# Patient Record
Sex: Female | Born: 1950
Health system: Southern US, Community
[De-identification: ages and names within clinical notes are randomized; demographics above are authoritative.]

## PROBLEM LIST (undated history)

## (undated) ENCOUNTER — Ambulatory Visit: Payer: Self-pay | Source: Home / Self Care

## (undated) DIAGNOSIS — E785 Hyperlipidemia, unspecified: Secondary | ICD-10-CM

## (undated) DIAGNOSIS — E7800 Pure hypercholesterolemia, unspecified: Secondary | ICD-10-CM

## (undated) DIAGNOSIS — M1990 Unspecified osteoarthritis, unspecified site: Secondary | ICD-10-CM

## (undated) DIAGNOSIS — R079 Chest pain, unspecified: Secondary | ICD-10-CM

## (undated) DIAGNOSIS — I10 Essential (primary) hypertension: Secondary | ICD-10-CM

## (undated) DIAGNOSIS — M810 Age-related osteoporosis without current pathological fracture: Secondary | ICD-10-CM

## (undated) DIAGNOSIS — E559 Vitamin D deficiency, unspecified: Secondary | ICD-10-CM

## (undated) DIAGNOSIS — G2581 Restless legs syndrome: Secondary | ICD-10-CM

## (undated) HISTORY — DX: Pure hypercholesterolemia, unspecified: E78.00

## (undated) HISTORY — DX: Age-related osteoporosis without current pathological fracture: M81.0

## (undated) HISTORY — DX: Vitamin D deficiency, unspecified: E55.9

## (undated) HISTORY — DX: Unspecified osteoarthritis, unspecified site: M19.90

## (undated) HISTORY — DX: Restless legs syndrome: G25.81

## (undated) HISTORY — DX: Hyperlipidemia, unspecified: E78.5

## (undated) HISTORY — DX: Chest pain, unspecified: R07.9

---

## 2004-03-16 ENCOUNTER — Emergency Department (HOSPITAL_COMMUNITY): Admission: EM | Admit: 2004-03-16 | Discharge: 2004-03-16 | Payer: Self-pay | Admitting: Emergency Medicine

## 2005-07-16 ENCOUNTER — Encounter: Admission: RE | Admit: 2005-07-16 | Discharge: 2005-07-16 | Payer: Self-pay | Admitting: Internal Medicine

## 2006-10-20 ENCOUNTER — Ambulatory Visit (HOSPITAL_COMMUNITY): Admission: RE | Admit: 2006-10-20 | Discharge: 2006-10-20 | Payer: Self-pay | Admitting: Obstetrics and Gynecology

## 2006-10-20 ENCOUNTER — Encounter (INDEPENDENT_AMBULATORY_CARE_PROVIDER_SITE_OTHER): Payer: Self-pay | Admitting: Obstetrics and Gynecology

## 2010-08-13 NOTE — Op Note (Signed)
[name], Jasmin Mcintosh                 ACCOUNT NO.:  [id number]   MEDICAL RECORD NO.:  [id number]          PATIENT TYPE:  AMB   LOCATION:  SDC                           FACILITY:  [facility]   PHYSICIAN:  Kendra H. Tenny Craw, MD     DATE OF BIRTH:  1950-11-16   DATE OF PROCEDURE:  10/20/2006  DATE OF DISCHARGE:                               OPERATIVE REPORT   PREOPERATIVE DIAGNOSES:  1. Retained intrauterine device.  2. Cervical stenosis.   POSTOPERATIVE DIAGNOSES:  1. Retained intrauterine device.  2. Cervical stenosis.   PROCEDURE:  Cervical dilation and removal of IUD.   SURGEON:  Freddrick March. Tenny Craw, MD.   ASSISTANT:  None.   ANESTHESIA:  MAC.   SPECIMEN:  Intrauterine device to pathology.   ESTIMATED BLOOD LOSS:  None.   COMPLICATIONS:  None.   PROCEDURE:  Ms. Word is a 60 year old postmenopausal woman who had an  intrauterine device placed at age 84 in [place] when she was started on  hormone replacement therapy.  She presented to the office for IUD  removal approximately one month ago.  At the time of attempted IUD  removal, the strings attached to the IUD avulsed.  Attempts to remove  the IUD in the office were unsuccessful and the patient could not  tolerate further procedure.  The decision was made to proceed with exam  under anesthesia and removal of the IUD under anesthesia.  Following the  appropriate informed consent, the patient was brought to the operating  room where MAC anesthesia was administered and found to be adequate.  She was placed in the dorsal supine position in [name] stirrups, prepped  and draped in the normal sterile fashion.  The speculum was placed into  the vagina and the anterior lip of the cervix was grasped with a single-  tooth tenaculum.  I injected 10 mL of 1% lidocaine in a paracervical  fashion and the cervix was serially dilated with Hank dilators.  A  uterine forceps was then passed transcervically to the fundus and then  the  IUD was grasped and  removed without complication.  The single-tooth  tenaculum was removed from the anterior lip of the cervix and the  speculum was removed from the vagina.  The patient tolerated the  procedure well and was brought to the recovery room in stable condition  following the procedure.      Freddrick March. Tenny Craw, MD  Electronically Signed     KHR/MEDQ  D:  10/20/2006  T:  10/20/2006  Job:  [id number]

## 2011-01-13 LAB — CBC
HCT: 40.7
Hemoglobin: 13.9
MCHC: 34.2
MCV: 88.7
Platelets: 277
RBC: 4.59
RDW: 11.9
WBC: 6

## 2013-06-09 DIAGNOSIS — E785 Hyperlipidemia, unspecified: Secondary | ICD-10-CM | POA: Insufficient documentation

## 2017-10-14 ENCOUNTER — Institutional Professional Consult (permissible substitution): Payer: Self-pay | Admitting: Pulmonary Disease

## 2017-10-15 ENCOUNTER — Ambulatory Visit (INDEPENDENT_AMBULATORY_CARE_PROVIDER_SITE_OTHER): Payer: Federal, State, Local not specified - PPO | Admitting: Pulmonary Disease

## 2017-10-15 ENCOUNTER — Other Ambulatory Visit: Payer: Federal, State, Local not specified - PPO

## 2017-10-15 ENCOUNTER — Encounter: Payer: Self-pay | Admitting: Pulmonary Disease

## 2017-10-15 ENCOUNTER — Ambulatory Visit (INDEPENDENT_AMBULATORY_CARE_PROVIDER_SITE_OTHER)
Admission: RE | Admit: 2017-10-15 | Discharge: 2017-10-15 | Disposition: A | Discharge status: Home / Self Care | Payer: Federal, State, Local not specified - PPO | Source: Ambulatory Visit | Attending: Pulmonary Disease | Admitting: Pulmonary Disease

## 2017-10-15 VITALS — BP 138/80 | HR 74 | Ht 64.0 in | Wt 115.2 lb

## 2017-10-15 DIAGNOSIS — R059 Cough, unspecified: Secondary | ICD-10-CM

## 2017-10-15 DIAGNOSIS — R05 Cough: Secondary | ICD-10-CM | POA: Diagnosis not present

## 2017-10-15 NOTE — Patient Instructions (Signed)
Recurrent cough  Improvement in symptoms following use of antibiotics and steroids  Currently on over-the-counter antireflux  Will obtain a chest x-ray  Obtain a pulmonary function study  Hypersensitivity panel  Return to the office in about 8 weeks  If recurrence of symptoms prior to next visit, encouraged to call and we will try and see you in the office when you are having active symptoms

## 2017-10-15 NOTE — Progress Notes (Signed)
Jasmin DakinHansa Mcintosh    [id number]    [birth year]  Primary Care Physician:Patient, No Pcp Per  Referring Physician: Verlon AuBoyd, Tammy Lamonica, MD [address] [place] [id number]  Chief complaint:   Cough and shortness of breath Recent treatment for bronchitis  HPI:  Has had recurrent respiratory tract infections recently with cough and shortness of breath, cough productive of clear phlegm Not feeling acutely ill at present Does not have any history of the asthma No history of significant allergies Usually healthy only taking medications for cholesterol-which is well controlled Never smoker  During one recent exacerbation she did have some wheezing  Does not wheeze on a regular basis, she is not limited with activities She has no symptoms suggesting reflux--with recurrence of symptoms this is been considered and she was encouraged to take over-the-counter reflux medications  Pets: No pets Occupation: Occasional nasal stuffiness and congestion at work, exposure to [employer] materials Smoking history: Never smoker  travel history: Relevant family history: No significant contributory family history  Outpatient Encounter Medications as of 10/15/2017  Medication Sig  . albuterol (PROVENTIL HFA;VENTOLIN HFA) 108 (90 Base) MCG/ACT inhaler INHALE 2 PUFFS BY MOUTH EVERY 6 HOURS AS NEEDED FOR WHEEZING  . atorvastatin (LIPITOR) 20 MG tablet Take 20 mg by mouth daily.  . cetirizine (ZYRTEC) 10 MG tablet Take 10 mg by mouth daily.  . cholecalciferol (QC VITAMIN D3) 400 units TABS tablet Take 800 Units by mouth.  . ibandronate (BONIVA) 150 MG tablet Take 150 mg by mouth every 30 (thirty) days. Take in the morning with a full glass of water, on an empty stomach, and do not take anything else by mouth or lie down for the next 30 min.  . Ibuprofen (ADVIL PO) Take by mouth.  . pantoprazole (PROTONIX) 40 MG tablet Take 40 mg by mouth daily.  . traMADol (ULTRAM) 50 MG tablet  Take by mouth every 6 (six) hours as needed.  . [DISCONTINUED] predniSONE (DELTASONE) 50 MG tablet TAKE 1 TABLET BY MOUTH ONCE DAILY FOR 5 DAYS  . [DISCONTINUED] azithromycin (ZITHROMAX) 250 MG tablet    No facility-administered encounter medications on file as of 10/15/2017.     Allergies as of 10/15/2017  . (Not on File)    Past Medical History:  Diagnosis Date  . Arthritis   . Chest pain at rest   . Hypercholesterolemia   . Hyperlipemia   . Osteoporosis   . Restless leg   . Vitamin D deficiency   . Vitamin D insufficiency      Family History  Problem Relation Age of Onset  . Diabetes Father   . Hyperlipidemia Sister     Social History   Socioeconomic History  . Marital status: Married    Spouse name: Not on file  . Number of children: Not on file  . Years of education: Not on file  . Highest education level: Not on file  Occupational History  . Not on file  Social Needs  . Financial resource strain: Not on file  . Food insecurity:    Worry: Not on file    Inability: Not on file  . Transportation needs:    Medical: Not on file    Non-medical: Not on file  Tobacco Use  . Smoking status: Never Smoker  . Smokeless tobacco: Never Used  Substance and Sexual Activity  . Alcohol use: Not Currently    Frequency: Never  . Drug use:  Never  . Sexual activity: Not on file  Lifestyle  . Physical activity:    Days per week: Not on file    Minutes per session: Not on file  . Stress: Not on file  Relationships  . Social connections:    Talks on phone: Not on file    Gets together: Not on file    Attends religious service: Not on file    Active member of club or organization: Not on file    Attends meetings of clubs or organizations: Not on file    Relationship status: Not on file  . Intimate partner violence:    Fear of current or ex partner: Not on file    Emotionally abused: Not on file    Physically abused: Not on file    Forced sexual activity: Not on  file  Other Topics Concern  . Not on file  Social History Narrative  . Not on file    Review of Systems  Constitutional: Negative.   HENT: Negative.   Respiratory: Positive for cough and shortness of breath.   Cardiovascular: Negative.   Gastrointestinal: Negative.   Endocrine: Negative.   Genitourinary: Negative.   Musculoskeletal: Negative.   Allergic/Immunologic: Negative.   Neurological: Negative.   Hematological: Negative.     Vitals:   10/15/17 1410  BP: 138/80  Pulse: 74  SpO2: 97%     Physical Exam  Constitutional: She is oriented to person, place, and time. She appears well-developed and well-nourished.  HENT:  Head: Normocephalic and atraumatic.  Eyes: Pupils are equal, round, and reactive to light. Conjunctivae and EOM are normal. Right eye exhibits no discharge. Left eye exhibits no discharge.  Neck: Normal range of motion. Neck supple.  Cardiovascular: Normal rate and regular rhythm.  Pulmonary/Chest: Effort normal. No respiratory distress. She has no wheezes.  Abdominal: Soft. Bowel sounds are normal. She exhibits no distension.  Musculoskeletal: Normal range of motion. She exhibits no edema or deformity.  Neurological: She is alert and oriented to person, place, and time. No cranial nerve deficit.  Skin: Skin is warm and dry. No erythema.  Psychiatric: She has a normal mood and affect.    Data Reviewed:  Recent blood work reviewed revealing elevated eosinophils  Other blood work are negative Chest x-ray reviewed--last one was in January-no infiltrate  Assessment:  1.  Recurrent bronchitis-symptoms are improving recently with recent use of antibiotics  2.  Shortness of breath--she is not limited with activities  3.  Chronic recurrent cough-has had at least 4 recent exacerbations requiring intervention  4.  Possible asthma-rare wheezing, possible allergic asthma   Plan/Recommendations: . Obtain pulmonary function study  .  Hypersensitivity  panel  .  Will repeat a chest x-ray  .  We will see her back in the office in about 8 weeks  .  Encouraged to continue using over-the-counter reflux medications  Virl Diamond MD [facility] 10/15/2017, 3:36 PM  CC: Verlon Au, MD

## 2017-10-21 LAB — HYPERSENSITIVITY PNEUMONITIS
A. Pullulans Abs: NEGATIVE
A.Fumigatus #1 Abs: NEGATIVE
Micropolyspora faeni, IgG: NEGATIVE
Pigeon Serum Abs: NEGATIVE
Thermoact. Saccharii: NEGATIVE
Thermoactinomyces vulgaris, IgG: NEGATIVE

## 2017-11-17 ENCOUNTER — Telehealth: Payer: Self-pay | Admitting: Pulmonary Disease

## 2017-11-17 NOTE — Telephone Encounter (Signed)
Spoke with pt. States that she is not improving since seeing Dr. Wynona Neatlalere in July. Pt has been scheduled to see Beth on 11/18/17 at 9am. Nothing further was needed.

## 2017-11-18 ENCOUNTER — Other Ambulatory Visit (INDEPENDENT_AMBULATORY_CARE_PROVIDER_SITE_OTHER): Payer: Federal, State, Local not specified - PPO

## 2017-11-18 ENCOUNTER — Ambulatory Visit: Payer: Federal, State, Local not specified - PPO | Admitting: Primary Care

## 2017-11-18 ENCOUNTER — Encounter: Payer: Self-pay | Admitting: Primary Care

## 2017-11-18 VITALS — BP 126/88 | HR 78 | Ht 60.0 in | Wt 115.6 lb

## 2017-11-18 DIAGNOSIS — R053 Chronic cough: Secondary | ICD-10-CM | POA: Insufficient documentation

## 2017-11-18 DIAGNOSIS — R059 Cough, unspecified: Secondary | ICD-10-CM

## 2017-11-18 DIAGNOSIS — R05 Cough: Secondary | ICD-10-CM

## 2017-11-18 LAB — BASIC METABOLIC PANEL
BUN: 10 mg/dL (ref 6–23)
CO2: 28 mEq/L (ref 19–32)
Calcium: 9.9 mg/dL (ref 8.4–10.5)
Chloride: 104 mEq/L (ref 96–112)
Creatinine, Ser: 0.78 mg/dL (ref 0.40–1.20)
GFR: 77.91 mL/min (ref >60.00)
Glucose, Bld: 70 mg/dL (ref 70–99)
Potassium: 4.1 mEq/L (ref 3.5–5.1)
Sodium: 140 mEq/L (ref 135–145)

## 2017-11-18 MED ORDER — HYDROCODONE-HOMATROPINE 5-1.5 MG/5ML PO SYRP: 5.0000 mL | ORAL_SOLUTION | Freq: Four times a day (QID) | ORAL | 0 refills | Status: DC | PRN

## 2017-11-18 NOTE — Patient Instructions (Addendum)
  Cough: Take Protonix in the morning 30 min before eating Take an antihistamine (such as zyrtec OR Claritin) at bedtime Take mucinex twice daily  Prescription cough medication at bedtime as needed for cough  Use rescue inhaler as needed for heavy breathing or wheeze  Patient needs Chest CT with contrast  Re: chronic cough >6 months   PFTs on September 12th already schedule, follow up vist with Dr. Wynona Neatlalere after

## 2017-11-18 NOTE — Assessment & Plan Note (Signed)
Productive cough with clear sputum x 6 months. Afebrile  CXR clear Checking sputum culture  PFT's scheduled for sept 12th Need CT chest with contrast   Tx: - Antihistamine daily   - Prontonix daily  - Mucinex BID - Hydromet at bedtime prn- safe precautions reviewed, PMP reviewed no unexpected prescriptions found  - Albuterol hfa prn

## 2017-11-18 NOTE — Progress Notes (Signed)
 @Patient  ID: Jasmin Mcintosh, female    DOB: 1950/11/26, 67 y.o.   MRN: [id number]  Chief Complaint  Patient presents with  . Acute Visit    6 month cough with white sputum and tight chest in AM    Referring provider: No ref. provider found  HPI: 67 year old female, never smoked. PMH hyperlipidemia. Patient of Dr. Wynona Neatlalere, seen for initial consult on 07/18 for recurrent cough >6 months. She has history of URI symptoms with associated sob and wheeze. CXR normal. Needs PFTs. No hx asthma.   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 States that she has had a productive cough with thick white/clear sputum x6 months. Symptoms are worse in the morning but can linger during the day. No significant wheezing or sob, only during episodes of coughing. She will occasionally take cough medication at night. She has not been taking antihistamine or Protonix. Has not required rescue inhaler. No pets. She does report travel to [place] and dental surgery preceding symptoms. She did not notice any improvement with prednisone. Denies fever, chills, nasal congestion, hemoptysis, purulent sputum, heartburn/belching.   Not on File  Immunization History  Administered Date(s) Administered  . Influenza, Seasonal, Injecte, Preservative Fre 04/21/2017  . Influenza-Unspecified 03/16/2014, 01/15/2015  . Pneumococcal Conjugate-13 06/07/2014  . Pneumococcal Polysaccharide-23 06/08/2015    Past Medical History:  Diagnosis Date  . Arthritis   . Chest pain at rest   . Hypercholesterolemia   . Hyperlipemia   . Osteoporosis   . Restless leg   . Vitamin D deficiency   . Vitamin D insufficiency     Tobacco History: Social History    Tobacco Use  Smoking Status Never Smoker  Smokeless Tobacco Never Used   Counseling given: Not Answered   Outpatient Medications Prior to Visit  Medication Sig Dispense Refill  . albuterol (PROVENTIL HFA;VENTOLIN HFA) 108 (90 Base) MCG/ACT inhaler INHALE 2 PUFFS BY MOUTH EVERY 6 HOURS AS NEEDED FOR WHEEZING  0  . atorvastatin (LIPITOR) 20 MG tablet Take 20 mg by mouth daily.    . cetirizine (ZYRTEC) 10 MG tablet Take 10 mg by mouth daily.    . cholecalciferol (QC VITAMIN D3) 400 units TABS tablet Take 800 Units by mouth.    . ibandronate (BONIVA) 150 MG tablet Take 150 mg by mouth every 30 (thirty) days. Take in the morning with a full glass of water, on an empty stomach, and do not take anything else by mouth or lie down for the next 30 min.    . Ibuprofen (ADVIL PO) Take by mouth.    . pantoprazole (PROTONIX) 40 MG tablet Take 40 mg by mouth daily.  1  . traMADol (ULTRAM) 50 MG tablet Take by mouth every 6 (six) hours as needed.     No facility-administered medications prior to visit.     Review of Systems  Review of Systems  Constitutional: Negative.   HENT: Negative.   Respiratory: Positive for cough. Negative for chest tightness, shortness of breath and wheezing.   Cardiovascular: Negative.   Gastrointestinal: Negative.   Skin: Negative.     Physical Exam  BP 126/88 (BP Location: Left Arm, Cuff Size: Normal)   Pulse (!) 102   Ht 5' (1.524 m)   Wt 115 lb 9.6  oz (52.4 kg)   SpO2 99%   BMI 22.58 kg/m  Physical Exam  Constitutional: She is oriented to person, place, and time. She appears well-developed and well-nourished.  HENT:  Head: Normocephalic and atraumatic.  Eyes: Pupils are equal, round, and reactive to light. EOM are normal.  Neck: Normal range of motion. Neck supple.  Cardiovascular: Regular rhythm.  Pulmonary/Chest: Effort normal. She has no wheezes. She exhibits no tenderness.  Musculoskeletal: Normal range of motion.  Neurological: She is alert and  oriented to person, place, and time.  Psychiatric: She has a normal mood and affect. Her behavior is normal. Judgment and thought content normal.     Lab Results:  CBC    Component Value Date/Time   WBC 6.0 10/20/2006 0908   RBC 4.59 10/20/2006 0908   HGB 13.9 10/20/2006 0908   HCT 40.7 10/20/2006 0908   PLT 277 10/20/2006 0908   MCV 88.7 10/20/2006 0908   MCHC 34.2 10/20/2006 0908   RDW 11.9 10/20/2006 0908    BMET No results found for: NA, K, CL, CO2, GLUCOSE, BUN, CREATININE, CALCIUM, GFRNONAA, GFRAA  BNP No results found for: BNP  ProBNP No results found for: PROBNP  Imaging: No results found.   Assessment & Plan:  67 year old female, never smoked. No significant pulmonary history. Presents with complaints of productive cough with thick white sputum x 6 months. She is afebrile. No signs or symptoms of bacterial infection. No significant reports of shortness of breath or wheezing. No improvement with abx or prednisone. CXR showing no active cardiopulmonary disease. PFTs scheduled for September. Needs chest CT and sputum culture.  Chronic cough Productive cough with clear sputum x 6 months. Afebrile  CXR clear Checking sputum culture  PFT's scheduled for sept 12th Need CT chest with contrast   Tx: - Antihistamine daily   - Prontonix daily  - Mucinex BID - Hydromet at bedtime prn- safe precautions reviewed, PMP reviewed no unexpected prescriptions found  - Albuterol hfa prn      Jasmin BayleyElizabeth W Lennex Pietila, NP 11/18/2017

## 2017-11-19 ENCOUNTER — Other Ambulatory Visit: Payer: Federal, State, Local not specified - PPO

## 2017-11-19 DIAGNOSIS — R05 Cough: Secondary | ICD-10-CM

## 2017-11-19 DIAGNOSIS — R053 Chronic cough: Secondary | ICD-10-CM

## 2017-11-22 LAB — RESPIRATORY CULTURE OR RESPIRATORY AND SPUTUM CULTURE
MICRO NUMBER:: 91003448
RESULT:: NORMAL
SPECIMEN QUALITY:: ADEQUATE

## 2017-11-24 ENCOUNTER — Telehealth: Payer: Self-pay | Admitting: Primary Care

## 2017-11-24 NOTE — Telephone Encounter (Signed)
Called patient, unable to reach. Left message to give us a call back.   Patient was given a written prescription of Hycodan on 8.21.19 per message she is stating that medication was out of stock at her pharmacy and she is requesting another medication. She will need to bring printed script back in before we can do that or she will need to take the prescription to another pharmacy to see if they have it in stock.

## 2017-11-25 ENCOUNTER — Telehealth: Payer: Self-pay | Admitting: Primary Care

## 2017-11-25 NOTE — Telephone Encounter (Signed)
Spoke with patient, patient states pharmacy told her hycodan was out of stock. Called pharmacy this morning, per pharmacists they now have hycodan in stock. Informed patient that pharmacy has hycodan in stock and she can now take prescription to pharmacy. Instructed patient to call office if she has any issues when she goes to pharmacy. Nothing further is needed.

## 2017-11-25 NOTE — Telephone Encounter (Signed)
Called patients pharmacy [facility] [address]. Spoke with pharmacist and they do have the medication in stock. Pharmacist stated that the patient has been trying to get the medication filled over the phone instead of coming in with the prescription. Called and spoke with patient. Advised her that she will need to take the prescription paper itself to the pharmacy with her for them to fill it. Patient verbalized understanding. Nothing further needed.

## 2017-11-25 NOTE — Telephone Encounter (Signed)
Pharmacy states patient came into pharmacy with printed Rx-was told it could be ordered-however patient left pharmacy with Rx.   Pt went back with Rx and then was told they only have 28ml on hand and would have to order. Pt is upset and would like to have her Rx filled to be able to use.  Pharmacy states patient can call other [facility] pharmacies around and see if another one has the QTY needed-also shows medication is on back order/out of stock.   Pt would have to pick up hard copy of Rx from [facility] to take to another pharmacy.    Pt is aware of above and states she will contact other pharmacies to see if they have enough medication to refill. Pt states she may even try to use cough syrup over the counter.   Will close note and wait for patient to decide

## 2017-12-07 ENCOUNTER — Ambulatory Visit (INDEPENDENT_AMBULATORY_CARE_PROVIDER_SITE_OTHER)
Admission: RE | Admit: 2017-12-07 | Discharge: 2017-12-07 | Disposition: A | Discharge status: Home / Self Care | Payer: Federal, State, Local not specified - PPO | Source: Ambulatory Visit | Attending: Primary Care | Admitting: Primary Care

## 2017-12-07 DIAGNOSIS — R05 Cough: Secondary | ICD-10-CM | POA: Diagnosis not present

## 2017-12-07 DIAGNOSIS — R059 Cough, unspecified: Secondary | ICD-10-CM

## 2017-12-07 MED ORDER — IOPAMIDOL (ISOVUE-300) INJECTION 61%
80.0000 mL | Freq: Once | INTRAVENOUS | Status: AC | PRN
  Administered 2017-12-07: 80 mL via INTRAVENOUS

## 2017-12-10 ENCOUNTER — Ambulatory Visit (INDEPENDENT_AMBULATORY_CARE_PROVIDER_SITE_OTHER): Payer: Federal, State, Local not specified - PPO | Admitting: Pulmonary Disease

## 2017-12-10 ENCOUNTER — Ambulatory Visit: Payer: Federal, State, Local not specified - PPO | Admitting: Pulmonary Disease

## 2017-12-10 ENCOUNTER — Encounter: Payer: Self-pay | Admitting: Pulmonary Disease

## 2017-12-10 VITALS — BP 110/78 | HR 79 | Ht 58.47 in | Wt 116.4 lb

## 2017-12-10 DIAGNOSIS — R059 Cough, unspecified: Secondary | ICD-10-CM

## 2017-12-10 DIAGNOSIS — R05 Cough: Secondary | ICD-10-CM

## 2017-12-10 DIAGNOSIS — Z23 Encounter for immunization: Secondary | ICD-10-CM

## 2017-12-10 LAB — PULMONARY FUNCTION TEST
DL/VA % PRED: 112 %
DL/VA: 4.49 ml/min/mmHg/L
DLCO unc % pred: 112 %
DLCO unc: 18.86 ml/min/mmHg
FEF 25-75 POST: 1.69 L/s
FEF 25-75 PRE: 1.42 L/s
FEF2575-%CHANGE-POST: 18 %
FEV1-%Change-Post: 2 %
FEV1-POST: 2 L
FEV1-PRE: 1.95 L
FEV1FVC-%Change-Post: 8 %
FEV6-%CHANGE-POST: -5 %
FEV6-Post: 2.44 L
FEV6-Pre: 2.58 L
FEV6FVC-%CHANGE-POST: 0 %
FVC-%Change-Post: -5 %
FVC-Post: 2.44 L
FVC-Pre: 2.59 L
Post FEV1/FVC ratio: 82 %
Post FEV6/FVC ratio: 100 %
Pre FEV1/FVC ratio: 75 %
Pre FEV6/FVC Ratio: 100 %
RV % pred: 136 %
RV: 2.57 L
TLC % pred: 120 %
TLC: 5.1 L

## 2017-12-10 LAB — NITRIC OXIDE: Nitric Oxide: 42

## 2017-12-10 MED ORDER — MOMETASONE FUROATE 220 MCG/INH IN AEPB: 2.0000 | INHALATION_SPRAY | Freq: Every day | RESPIRATORY_TRACT | 12 refills | Status: DC

## 2017-12-10 MED ORDER — MOMETASONE FUROATE 200 MCG/ACT IN AERO: 2.0000 | INHALATION_SPRAY | Freq: Two times a day (BID) | RESPIRATORY_TRACT | 0 refills | Status: DC

## 2017-12-10 NOTE — Progress Notes (Signed)
Jasmin Mcintosh    [id number]    1950-06-06  Primary Care Physician:[facility]  Referring Physician: No referring provider defined for this encounter.  Chief complaint:   Cough and shortness of breath Was recently at urgent care   HPI:  Has had recurrent respiratory tract infections recently with cough and shortness of breath, cough productive of clear phlegm Not feeling acutely ill at present Does not have any history of the asthma No history of significant allergies Usually healthy only taking medications for cholesterol-which is well controlled Never smoker  She was given a course of steroids which helped symptoms She is on Zyrtec, on Protonix  Urgent care visit was following shortness of breath, wheezing, cough  Does not wheeze on a regular basis, she is not limited with activities  Pets: No pets Occupation: Occasional nasal stuffiness and congestion at work, exposure to [employer] materials Smoking history: Never smoker   Outpatient Encounter Medications as of 12/10/2017  Medication Sig  . albuterol (PROVENTIL HFA;VENTOLIN HFA) 108 (90 Base) MCG/ACT inhaler INHALE 2 PUFFS BY MOUTH EVERY 6 HOURS AS NEEDED FOR WHEEZING  . atorvastatin (LIPITOR) 20 MG tablet Take 20 mg by mouth daily.  . cetirizine (ZYRTEC) 10 MG tablet Take 10 mg by mouth daily.  . cholecalciferol (QC VITAMIN D3) 400 units TABS tablet Take 800 Units by mouth.  . ibandronate (BONIVA) 150 MG tablet Take 150 mg by mouth every 30 (thirty) days. Take in the morning with a full glass of water, on an empty stomach, and do not take anything else by mouth or lie down for the next 30 min.  . Ibuprofen (ADVIL PO) Take by mouth.  . montelukast (SINGULAIR) 10 MG tablet Take 10 mg by mouth daily.  . pantoprazole (PROTONIX) 40 MG tablet Take 40 mg by mouth daily.  . traMADol (ULTRAM) 50 MG tablet Take by mouth every 6 (six) hours as needed.  Marland Kitchen. HYDROcodone-homatropine (HYCODAN) 5-1.5 MG/5ML syrup  Take 5 mLs by mouth every 6 (six) hours as needed for cough. (Patient not taking: Reported on 12/10/2017)  . mometasone (ASMANEX, 60 METERED DOSES,) 220 MCG/INH inhaler Inhale 2 puffs into the lungs daily.   No facility-administered encounter medications on file as of 12/10/2017.     Allergies as of 12/10/2017  . (Not on File)    Past Medical History:  Diagnosis Date  . Arthritis   . Chest pain at rest   . Hypercholesterolemia   . Hyperlipemia   . Osteoporosis   . Restless leg   . Vitamin D deficiency   . Vitamin D insufficiency      Family History  Problem Relation Age of Onset  . Diabetes Father   . Hyperlipidemia Sister     Social History   Socioeconomic History  . Marital status: Married    Spouse name: Not on file  . Number of children: Not on file  . Years of education: Not on file  . Highest education level: Not on file  Occupational History  . Not on file  Social Needs  . Financial resource strain: Not on file  . Food insecurity:    Worry: Not on file    Inability: Not on file  . Transportation needs:    Medical: Not on file    Non-medical: Not on file  Tobacco Use  . Smoking status: Never Smoker  . Smokeless tobacco: Never Used  Substance and Sexual Activity  . Alcohol use: Not Currently  Frequency: Never  . Drug use: Never  . Sexual activity: Not on file  Lifestyle  . Physical activity:    Days per week: Not on file    Minutes per session: Not on file  . Stress: Not on file  Relationships  . Social connections:    Talks on phone: Not on file    Gets together: Not on file    Attends religious service: Not on file    Active member of club or organization: Not on file    Attends meetings of clubs or organizations: Not on file    Relationship status: Not on file  . Intimate partner violence:    Fear of current or ex partner: Not on file    Emotionally abused: Not on file    Physically abused: Not on file    Forced sexual activity: Not on  file  Other Topics Concern  . Not on file  Social History Narrative  . Not on file    Review of Systems  Constitutional: Negative.  Negative for fatigue.  HENT: Negative.   Respiratory: Negative for cough and shortness of breath.   Cardiovascular: Negative.   Gastrointestinal: Negative.   Musculoskeletal: Negative.     Vitals:   12/10/17 0953  BP: 110/78  Pulse: 79  SpO2: 100%     Physical Exam  Constitutional: She is oriented to person, place, and time. She appears well-developed and well-nourished.  HENT:  Head: Normocephalic and atraumatic.  Eyes: Pupils are equal, round, and reactive to light. Conjunctivae and EOM are normal. Right eye exhibits no discharge. Left eye exhibits no discharge.  Neck: Normal range of motion. Neck supple. No tracheal deviation present. No thyromegaly present.  Cardiovascular: Normal rate and regular rhythm.  Pulmonary/Chest: Effort normal. No respiratory distress. She has no wheezes.  Abdominal: Soft. Bowel sounds are normal. She exhibits no distension.  Neurological: She is oriented to person, place, and time.    Data Reviewed:  CT chest reviewed by myself showing no acute infiltrate  Pulmonary function study shows no obstruction, no significant bronchodilator response, no restriction, normal diffusing capacity  Assessment:   1.  Recurrent bronchitis-recent ED visit, symptoms better  2.  Shortness of breath-resolved  3.  Possible asthma especially with a very elevated FeNO  Of 42  4.  Inhaler use was discussed, technique reviewed and taught.  5.  Need to rinse following use of inhaled steroid discussed   Plan/Recommendations: 1.  We will start Asmanex 200, 2 puffs daily -This can be escalated to twice daily if symptoms still not well controlled -Has not been on steroid inhalers in the past  .  We will see her back in the office in about 4 to 6 weeks  .  Encouraged to continue medications for reflux and allergies  .   Following stabilization of symptoms we will consider de-escalating therapy  .  Flu vaccine today  Virl Diamond MD [facility] 12/10/2017, 10:18 AM  CC: No ref. provider found

## 2017-12-10 NOTE — Patient Instructions (Addendum)
Persistent cough  Recent visit to the urgent care  CT scan of the chest reviewed with no evidence of infection/no abnormalities  Pulmonary function test did not show obstruction  We will start you on an inhaled steroid to help with possible airway inflammation contributing to your cough, continue treatment with allergy medicines, medications for reflux  We will see you back in the office in 4 to 6 weeks  Always remember to rinse your mouth following using a steroid inhaler  We will give you a flu shot today

## 2017-12-10 NOTE — Progress Notes (Signed)
PFT completed today. 12/10/17  

## 2017-12-10 NOTE — Addendum Note (Signed)
Addended by: Sylvester HarderMOORE, Keen Ewalt R on: 12/10/2017 02:33 PM   Modules accepted: Orders

## 2017-12-10 NOTE — Addendum Note (Signed)
Addended by: Zane HeraldMOORE, Seham Gardenhire R on: 12/10/2017 12:47 PM   Modules accepted: Orders

## 2018-01-07 ENCOUNTER — Ambulatory Visit: Payer: Federal, State, Local not specified - PPO | Admitting: Primary Care

## 2018-01-07 ENCOUNTER — Encounter: Payer: Self-pay | Admitting: Primary Care

## 2018-01-07 VITALS — BP 114/68 | HR 80 | Ht 60.0 in | Wt 119.0 lb

## 2018-01-07 DIAGNOSIS — R05 Cough: Secondary | ICD-10-CM | POA: Diagnosis not present

## 2018-01-07 DIAGNOSIS — R053 Chronic cough: Secondary | ICD-10-CM

## 2018-01-07 LAB — NITRIC OXIDE: NITRIC OXIDE: 53

## 2018-01-07 MED ORDER — BECLOMETHASONE DIPROP HFA 40 MCG/ACT IN AERB: 2.0000 | INHALATION_SPRAY | Freq: Two times a day (BID) | RESPIRATORY_TRACT | 0 refills | Status: DC

## 2018-01-07 NOTE — Progress Notes (Signed)
@Patient  ID: Jasmin Mcintosh, female    DOB: 25-Nov-1950, 67 y.o.   MRN: [id number]  Chief Complaint  Patient presents with  . Follow-up    cough is improved, only bothersome qam with slight production- clear mucus.     Referring provider: [facility]  HPI: 67 year old female, never smoked. PMH hyperlipidemia. Patient of Dr. Wynona Neat, seen for initial consult on 07/18 for recurrent cough >6 months. She has history of URI symptoms with associated sob and wheeze. CXR normal. Needs PFTs. No hx asthma.   11/18/2017- OV/ Clent Ridges Patient presents today for acute visit complaining of continued coughing symptoms. States that she has had a productive cough with thick white/clear sputum x6 months. Symptoms are worse in the morning but can linger during the day. No significant wheezing or sob, only during episodes of coughing. She will occasionally take cough medication at night. She has not been taking antihistamine or Protonix. Has not required rescue inhaler. No pets. She does report travel to [place] and dental surgery preceding symptoms. She did not notice any improvement with prednisone. Denies fever, chills, nasal congestion, hemoptysis, purulent sputum, heartburn/belching.  12/10/17- OV/ [place] Has had recurrent respiratory tract infections recently with cough and shortness of breath, cough productive of clear phlegm Not feeling acutely ill at present Does not have any history of the asthma No history of significant allergies Usually healthy only taking medications for cholesterol-which is well controlled Never smoker She was given a course of steroids which helped symptoms She is on Zyrtec, on Protonix Urgent care visit was following shortness of breath, wheezing, cough Does not wheeze on a regular basis, she is not limited with activities  01/07/2018 Patient presents today for 1 month follow-up visit for cough. Patient saw Dr. Wynona Neat on 12/10/17, FENO was elevated at 99. Started  on Asmanex 200 two daily. She feels better. Cough is only bothersome in the morning when she wakes up. Reports new rash to neck since starting Asmanex, unsure if its related. She did take 1/2 tab allergy tab around the same time rash started to help her sleep. She has been using Asmanex every other day to see if that helped with rash. Describes rash to neck as itchy. She has tried aloe vera with some improvement. Denies difficulty swallowing, breathing. No swelling to face or mouth.     Background/Environment:  Pets: No pets Occupation: Occasional nasal stuffiness and congestion at work, exposure to [employer] Smoking history: Never smoker  travel history: Relevant family history: No significant contributory family history   No Known Allergies  Immunization History  Administered Date(s) Administered  . Influenza, High Dose Seasonal PF 12/10/2017  . Influenza, Seasonal, Injecte, Preservative Fre 04/21/2017  . Influenza-Unspecified 03/16/2014, 01/15/2015  . Pneumococcal Conjugate-13 06/07/2014  . Pneumococcal Polysaccharide-23 06/08/2015    Past Medical History:  Diagnosis Date  . Arthritis   . Chest pain at rest   . Hypercholesterolemia   . Hyperlipemia   . Osteoporosis   . Restless leg   . Vitamin D deficiency   . Vitamin D insufficiency     Tobacco History: Social History   Tobacco Use  Smoking Status Never Smoker  Smokeless Tobacco Never Used   Counseling given: Not Answered   Outpatient Medications Prior to Visit  Medication Sig Dispense Refill  . albuterol (PROVENTIL HFA;VENTOLIN HFA) 108 (90 Base) MCG/ACT inhaler INHALE 2 PUFFS BY MOUTH EVERY 6 HOURS AS NEEDED FOR WHEEZING  0  . atorvastatin (LIPITOR) 20 MG tablet Take  20 mg by mouth daily.    . cetirizine (ZYRTEC) 10 MG tablet Take 10 mg by mouth daily.    . cholecalciferol (QC VITAMIN D3) 400 units TABS tablet Take 800 Units by mouth.    Marland Kitchen HYDROcodone-homatropine (HYCODAN) 5-1.5 MG/5ML syrup Take 5  mLs by mouth every 6 (six) hours as needed for cough. 240 mL 0  . ibandronate (BONIVA) 150 MG tablet Take 150 mg by mouth every 30 (thirty) days. Take in the morning with a full glass of water, on an empty stomach, and do not take anything else by mouth or lie down for the next 30 min.    . Ibuprofen (ADVIL PO) Take by mouth.    . Mometasone Furoate (ASMANEX HFA) 200 MCG/ACT AERO Inhale 2 puffs into the lungs 2 (two) times daily. 1 Inhaler 0  . montelukast (SINGULAIR) 10 MG tablet Take 10 mg by mouth daily.  1  . pantoprazole (PROTONIX) 40 MG tablet Take 40 mg by mouth daily.  1  . traMADol (ULTRAM) 50 MG tablet Take by mouth every 6 (six) hours as needed.     No facility-administered medications prior to visit.       Review of Systems  Review of Systems  Constitutional: Negative.   HENT: Negative.   Respiratory: Positive for cough. Negative for apnea, choking, chest tightness, shortness of breath, wheezing and stridor.   Cardiovascular: Negative.   Gastrointestinal: Negative.   Skin: Positive for rash.    Physical Exam  BP 114/68 (BP Location: Right Arm, Cuff Size: Normal)   Pulse 80   Ht 5' (1.524 m)   Wt 119 lb (54 kg)   SpO2 98%   BMI 23.24 kg/m  Physical Exam  Constitutional: She is oriented to person, place, and time. She appears well-developed and well-nourished. No distress.  HENT:  Head: Normocephalic and atraumatic.  Eyes: Pupils are equal, round, and reactive to light. EOM are normal.  Neck: Normal range of motion. Neck supple.  Cardiovascular: Normal rate, regular rhythm and normal heart sounds.  No murmur heard. Pulmonary/Chest: Effort normal and breath sounds normal. No respiratory distress. She has no wheezes.  CTA  Abdominal: Soft. Bowel sounds are normal. There is no tenderness.  Neurological: She is alert and oriented to person, place, and time.  Skin: Skin is warm and dry. Rash noted. No erythema.  Faint erythematous rash to neck. Does not involve face  or mouth. NO swelling.   Psychiatric: She has a normal mood and affect. Her behavior is normal. Judgment normal.     Lab Results:  CBC    Component Value Date/Time   WBC 6.0 10/20/2006 0908   RBC 4.59 10/20/2006 0908   HGB 13.9 10/20/2006 0908   HCT 40.7 10/20/2006 0908   PLT 277 10/20/2006 0908   MCV 88.7 10/20/2006 0908   MCHC 34.2 10/20/2006 0908   RDW 11.9 10/20/2006 0908    BMET    Component Value Date/Time   NA 140 11/18/2017 1001   K 4.1 11/18/2017 1001   CL 104 11/18/2017 1001   CO2 28 11/18/2017 1001   GLUCOSE 70 11/18/2017 1001   BUN 10 11/18/2017 1001   CREATININE 0.78 11/18/2017 1001   CALCIUM 9.9 11/18/2017 1001    BNP No results found for: BNP  ProBNP No results found for: PROBNP  Imaging: No results found.   Assessment & Plan:   Chronic cough - Improved with Asmanax, however, patient reports new rash to neck after starting inh (?  Unclear if related but will change to different ICS and lower dose ) - FENO remains elevated at 53 (99) - Trial Qvar 2 puffs twice daily (sample given) - Back up albuterol rescue inhaler prn sob/wheezing  - Continue omeprazole  - FU in 4-6 weeks     [name], NP 01/07/2018

## 2018-01-07 NOTE — Assessment & Plan Note (Addendum)
-   Improved with Asmanax, however, patient reports new rash to neck after starting inh (? Unclear if related but will change to different ICS and lower dose ) - FENO remains elevated at 53 (99) - Trial Qvar 2 puffs twice daily (sample given) - Back up albuterol rescue inhaler prn sob/wheezing  - Continue omeprazole  - FU in 4-6 weeks

## 2018-01-07 NOTE — Patient Instructions (Signed)
Stop Asmanex (? possible rash)  Start lower dose maintenance inhaler - Qvar 2 puffs twice daily   Continues omeprazole    Follow up in 4-6 weeks with Dr. Wynona Neat or NP

## 2018-02-18 ENCOUNTER — Ambulatory Visit: Payer: Federal, State, Local not specified - PPO | Admitting: Pulmonary Disease

## 2018-04-07 ENCOUNTER — Encounter: Payer: Self-pay | Admitting: Pulmonary Disease

## 2018-04-07 ENCOUNTER — Ambulatory Visit: Payer: Federal, State, Local not specified - PPO | Admitting: Pulmonary Disease

## 2018-04-07 VITALS — BP 126/74 | HR 88 | Ht 61.0 in | Wt 119.0 lb

## 2018-04-07 DIAGNOSIS — R059 Cough, unspecified: Secondary | ICD-10-CM

## 2018-04-07 DIAGNOSIS — R05 Cough: Secondary | ICD-10-CM

## 2018-04-07 MED ORDER — OMEPRAZOLE 20 MG PO CPDR: 20.0000 mg | DELAYED_RELEASE_CAPSULE | Freq: Every day | ORAL | 2 refills | Status: DC

## 2018-04-07 MED ORDER — BECLOMETHASONE DIPROPIONATE 80 MCG/ACT IN AERS: 1.0000 | INHALATION_SPRAY | Freq: Two times a day (BID) | RESPIRATORY_TRACT | 6 refills | Status: AC

## 2018-04-07 NOTE — Progress Notes (Signed)
Jasmin Mcintosh    [id number]    03-29-1951  Primary Care Physician:[facility]  Referring Physician: [facility] [address] [place], [place] [address]  Chief complaint:   Cough and shortness of breath Slight improvement in symptoms  HPI: Was started on Qvar the last time she was here-did improve symptoms She does not want to be using inhalers on a regular basis so she did not refill the medication She was also advised by dentist recently that she has signs of erosion which may be related to reflux  Has had recurrent respiratory tract infections recently with cough and shortness of breath, cough productive of clear phlegm Not feeling acutely ill at present Does not have any history of the asthma No history of significant allergies Usually healthy only taking medications for cholesterol-which is well controlled Never smoker  She was given a course of steroids which helped symptoms She is on Zyrtec, on Protonix  Urgent care visit was following shortness of breath, wheezing, cough  Does not wheeze on a regular basis, she is not limited with activities  Pets: No pets Occupation: Occasional nasal stuffiness and congestion at work, exposure to [employer] Smoking history: Never smoker   Outpatient Encounter Medications as of 04/07/2018  Medication Sig  . albuterol (PROVENTIL HFA;VENTOLIN HFA) 108 (90 Base) MCG/ACT inhaler INHALE 2 PUFFS BY MOUTH EVERY 6 HOURS AS NEEDED FOR WHEEZING  . atorvastatin (LIPITOR) 20 MG tablet Take 20 mg by mouth daily.  . beclomethasone (QVAR) 80 MCG/ACT inhaler Inhale 1 puff into the lungs 2 (two) times daily.  . cetirizine (ZYRTEC) 10 MG tablet Take 10 mg by mouth daily.  . cholecalciferol (QC VITAMIN D3) 400 units TABS tablet Take 800 Units by mouth.  . ibandronate (BONIVA) 150 MG tablet Take 150 mg by mouth every 30 (thirty) days. Take in the morning with a full glass of water, on an empty  stomach, and do not take anything else by mouth or lie down for the next 30 min.  . Ibuprofen (ADVIL PO) Take by mouth.  . montelukast (SINGULAIR) 10 MG tablet Take 10 mg by mouth daily.  Marland Kitchen omeprazole (PRILOSEC) 20 MG capsule Take 1 capsule (20 mg total) by mouth daily.  . pantoprazole (PROTONIX) 40 MG tablet Take 40 mg by mouth daily.  . traMADol (ULTRAM) 50 MG tablet Take by mouth every 6 (six) hours as needed.  . [DISCONTINUED] beclomethasone (QVAR REDIHALER) 40 MCG/ACT inhaler Inhale 2 puffs into the lungs 2 (two) times daily. (Patient not taking: Reported on 04/07/2018)  . [DISCONTINUED] HYDROcodone-homatropine (HYCODAN) 5-1.5 MG/5ML syrup Take 5 mLs by mouth every 6 (six) hours as needed for cough. (Patient not taking: Reported on 04/07/2018)  . [DISCONTINUED] Mometasone Furoate (ASMANEX HFA) 200 MCG/ACT AERO Inhale 2 puffs into the lungs 2 (two) times daily. (Patient not taking: Reported on 04/07/2018)   No facility-administered encounter medications on file as of 04/07/2018.     Allergies as of 04/07/2018  . (No Known Allergies)    Past Medical History:  Diagnosis Date  . Arthritis   . Chest pain at rest   . Hypercholesterolemia   . Hyperlipemia   . Osteoporosis   . Restless leg   . Vitamin D deficiency   . Vitamin D insufficiency      Family History  Problem Relation Age of Onset  . Diabetes Father   . Hyperlipidemia Sister     Social History  Socioeconomic History  . Marital status: Married    Spouse name: Not on file  . Number of children: Not on file  . Years of education: Not on file  . Highest education level: Not on file  Occupational History  . Not on file  Social Needs  . Financial resource strain: Not on file  . Food insecurity:    Worry: Not on file    Inability: Not on file  . Transportation needs:    Medical: Not on file    Non-medical: Not on file  Tobacco Use  . Smoking status: Never Smoker  . Smokeless tobacco: Never Used  Substance and Sexual  Activity  . Alcohol use: Not Currently    Frequency: Never  . Drug use: Never  . Sexual activity: Not on file  Lifestyle  . Physical activity:    Days per week: Not on file    Minutes per session: Not on file  . Stress: Not on file  Relationships  . Social connections:    Talks on phone: Not on file    Gets together: Not on file    Attends religious service: Not on file    Active member of club or organization: Not on file    Attends meetings of clubs or organizations: Not on file    Relationship status: Not on file  . Intimate partner violence:    Fear of current or ex partner: Not on file    Emotionally abused: Not on file    Physically abused: Not on file    Forced sexual activity: Not on file  Other Topics Concern  . Not on file  Social History Narrative  . Not on file    Review of Systems  Constitutional: Negative.  Negative for fatigue.  HENT: Negative.   Respiratory: Negative for cough and shortness of breath.   Cardiovascular: Negative.   Gastrointestinal: Negative.   Musculoskeletal: Negative.     Vitals:   04/07/18 1612  BP: 126/74  Pulse: 88  SpO2: 94%     Physical Exam  Constitutional: She appears well-developed and well-nourished.  HENT:  Head: Normocephalic and atraumatic.  Eyes: Pupils are equal, round, and reactive to light. Conjunctivae and EOM are normal. Right eye exhibits no discharge. Left eye exhibits no discharge.  Neck: Normal range of motion. Neck supple. No tracheal deviation present. No thyromegaly present.  Cardiovascular: Normal rate and regular rhythm.  Pulmonary/Chest: Effort normal and breath sounds normal. No respiratory distress. She has no wheezes. She has no rales. She exhibits no tenderness.  Abdominal: Soft. Bowel sounds are normal. She exhibits no distension.    Data Reviewed:  CT chest reviewed by myself showing no acute infiltrate  Pulmonary function study shows no obstruction, no significant bronchodilator  response, no restriction, normal diffusing capacity  Assessment:   1.  Recurrent bronchitis-symptoms appear to be better controlled  2.  Shortness of breath-resolved  3.  Possible asthma  -Fina was elevated in the past but symptoms appear much better at present  4.  Inhaler use was discussed, technique reviewed and taught.  Plan/Recommendations: 1.  We will start her on Qvar the last time she was here, did help some  .  We will see her back in the office in about 3 months  .  Encouraged to continue medications for reflux and allergies    Virl DiamondAdewale  MD [facility] 04/07/2018, 4:28 PM  CC: [facility]

## 2018-04-07 NOTE — Patient Instructions (Signed)
Cough-slightly improved symptoms  We will refill Qvar 1 puff twice daily We will start you on Prilosec 20 mg daily  I will see you back in the office in about 3 months Call if any worsening of symptoms Call with any other concerns

## 2018-04-20 ENCOUNTER — Other Ambulatory Visit: Payer: Self-pay | Admitting: Pulmonary Disease

## 2018-04-20 MED ORDER — BECLOMETHASONE DIPROP HFA 80 MCG/ACT IN AERB: 2.0000 | INHALATION_SPRAY | Freq: Two times a day (BID) | RESPIRATORY_TRACT | 6 refills | Status: AC

## 2018-07-17 ENCOUNTER — Other Ambulatory Visit: Payer: Self-pay | Admitting: Pulmonary Disease

## 2018-07-22 ENCOUNTER — Ambulatory Visit: Payer: Federal, State, Local not specified - PPO | Admitting: Pulmonary Disease

## 2019-02-09 ENCOUNTER — Other Ambulatory Visit: Payer: Self-pay | Admitting: Pulmonary Disease

## 2019-04-26 ENCOUNTER — Ambulatory Visit: Payer: Federal, State, Local not specified - PPO

## 2019-05-06 ENCOUNTER — Ambulatory Visit: Payer: Self-pay

## 2019-05-07 ENCOUNTER — Ambulatory Visit: Payer: Self-pay | Attending: Internal Medicine

## 2019-05-07 DIAGNOSIS — Z23 Encounter for immunization: Secondary | ICD-10-CM | POA: Insufficient documentation

## 2019-05-07 NOTE — Progress Notes (Signed)
   Covid-19 Vaccination Clinic  Name:  Jasmin Mcintosh    MRN: [id number] DOB: 06/27/1950  05/07/2019  Jasmin Mcintosh was observed post Covid-19 immunization for 15 minutes without incidence. She was provided with Vaccine Information Sheet and instruction to access the V-Safe system.   Jasmin Mcintosh was instructed to call 911 with any severe reactions post vaccine: Marland Kitchen Difficulty breathing  . Swelling of your face and throat  . A fast heartbeat  . A bad rash all over your body  . Dizziness and weakness    Immunizations Administered    Name Date Dose VIS Date Route   Pfizer COVID-19 Vaccine 05/07/2019 10:41 AM 0.3 mL 03/11/2019 Intramuscular   Manufacturer: [employer], [employer]   Lot: [id number]   NDC: [address]-5

## 2019-05-17 ENCOUNTER — Ambulatory Visit: Payer: Federal, State, Local not specified - PPO

## 2019-05-31 ENCOUNTER — Ambulatory Visit: Payer: Self-pay

## 2019-05-31 ENCOUNTER — Ambulatory Visit: Payer: Self-pay | Attending: Internal Medicine

## 2019-05-31 DIAGNOSIS — Z23 Encounter for immunization: Secondary | ICD-10-CM | POA: Insufficient documentation

## 2019-05-31 NOTE — Progress Notes (Signed)
   Covid-19 Vaccination Clinic  Name:  Michela Herst    MRN: [id number] DOB: [birth year]  05/31/2019  Ms. Flury was observed post Covid-19 immunization for 15 minutes without incident. She was provided with Vaccine Information Sheet and instruction to access the V-Safe system.   Ms. Broxton was instructed to call 911 with any severe reactions post vaccine: Marland Kitchen Difficulty breathing  . Swelling of face and throat  . A fast heartbeat  . A bad rash all over body  . Dizziness and weakness   Immunizations Administered    Name Date Dose VIS Date Route   Pfizer COVID-19 Vaccine 05/31/2019  4:24 PM 0.3 mL 03/11/2019 Intramuscular   Manufacturer: [employer], [employer]   Lot: [id number]   NDC: [address]-1

## 2019-12-07 ENCOUNTER — Other Ambulatory Visit: Payer: Self-pay | Admitting: *Deleted

## 2019-12-07 ENCOUNTER — Other Ambulatory Visit: Payer: No Typology Code available for payment source

## 2019-12-07 ENCOUNTER — Other Ambulatory Visit: Payer: Self-pay

## 2019-12-07 DIAGNOSIS — Z20822 Contact with and (suspected) exposure to covid-19: Secondary | ICD-10-CM

## 2019-12-09 LAB — NOVEL CORONAVIRUS, NAA: SARS-CoV-2, NAA: NOT DETECTED

## 2019-12-09 LAB — SARS-COV-2, NAA 2 DAY TAT

## 2019-12-12 ENCOUNTER — Telehealth: Payer: Self-pay

## 2019-12-12 NOTE — Telephone Encounter (Signed)
Called and informed patient that test for Covid 19 was NEGATIVE. Discussed signs and symptoms of Covid 19 : fever, chills, respiratory symptoms, cough, ENT symptoms, sore throat, SOB, muscle pain, diarrhea, headache, loss of taste/smell, close exposure to COVID-19 patient. Pt instructed to call PCP if they develop the above signs and sx. Pt also instructed to call 911 if having respiratory issues/distress.  Pt verbalized understanding. Spoke with pt's husband.

## 2020-03-07 IMAGING — CT CT CHEST W/ CM
2 of 3 series · 15 of 36 positions shown, 18 images · IV contrast (ISOVUE 300)
Comparison: Chest x-ray 04/28/2017, 10/15/2017

CLINICAL DATA: Persistent cough and white sputum for 6 months.

EXAM:
CT CHEST WITH CONTRAST
TECHNIQUE: Multidetector CT imaging of the chest was performed during
intravenous contrast administration.
CONTRAST:  80mL PNI6JC-OYY IOPAMIDOL (PNI6JC-OYY) INJECTION 61%

[Series 2: thorax · axial · 0.63mm/px · z∈[-333,-57]mm · 12 of 164 slices shown, 15 images]
[im 13/164  mediastinal]
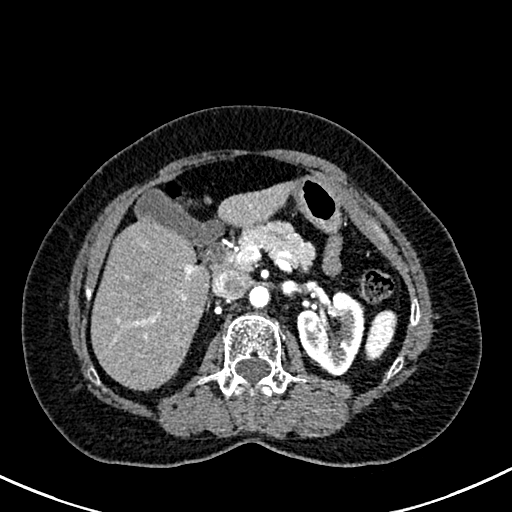
[im 13/164  lung]
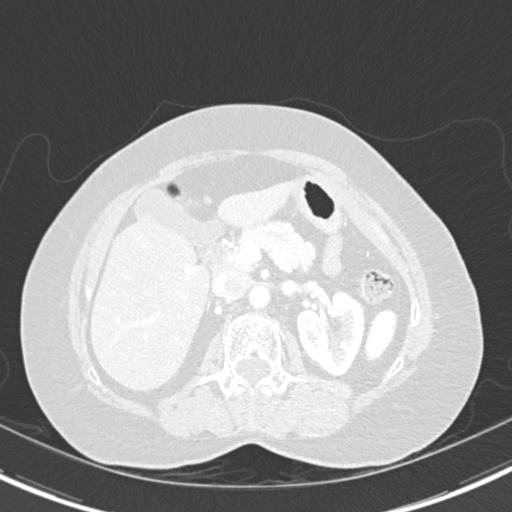
[im 25/164  lung]
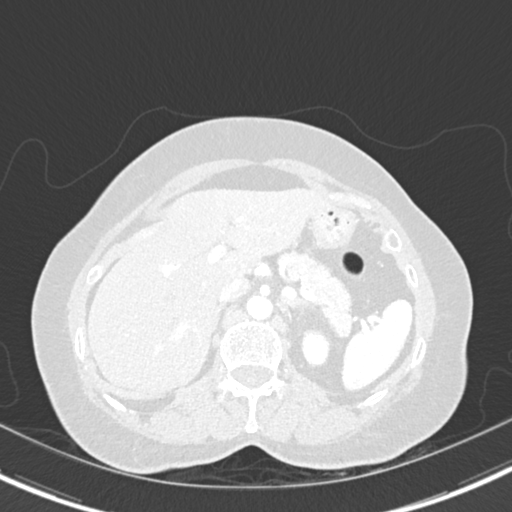
[im 37/164  lung]
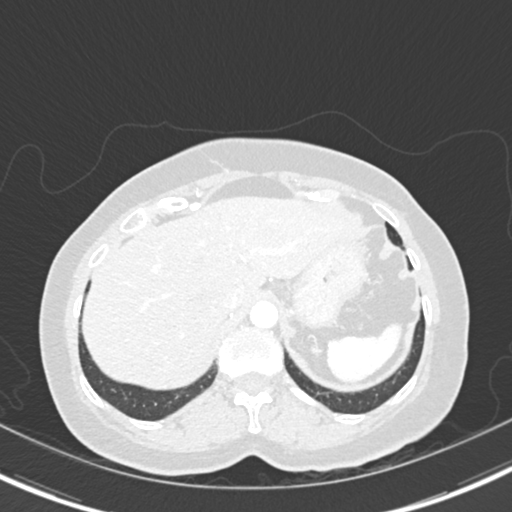
[im 49/164  lung]
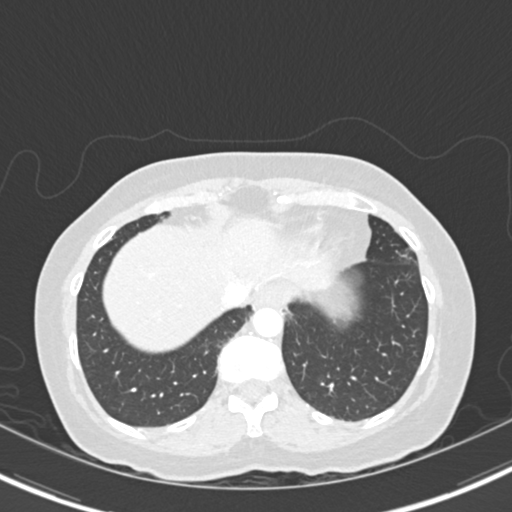
[im 61/164  mediastinal]
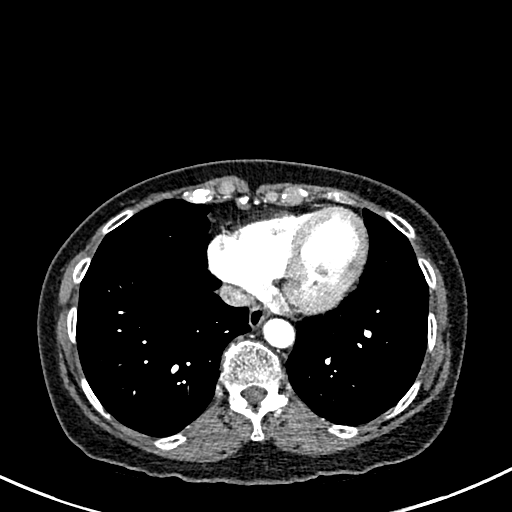
[im 61/164  lung]
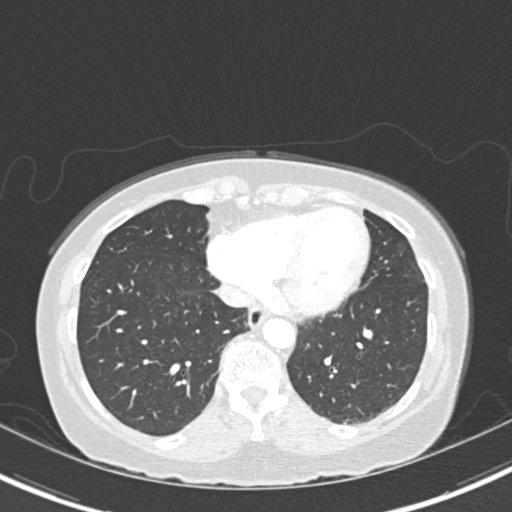
[im 73/164  lung]
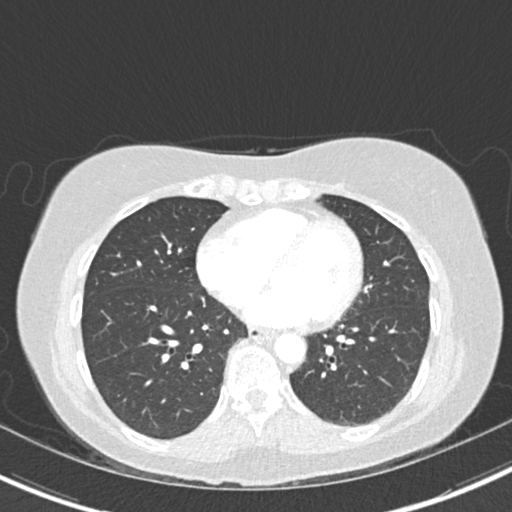
[im 91/164  lung]
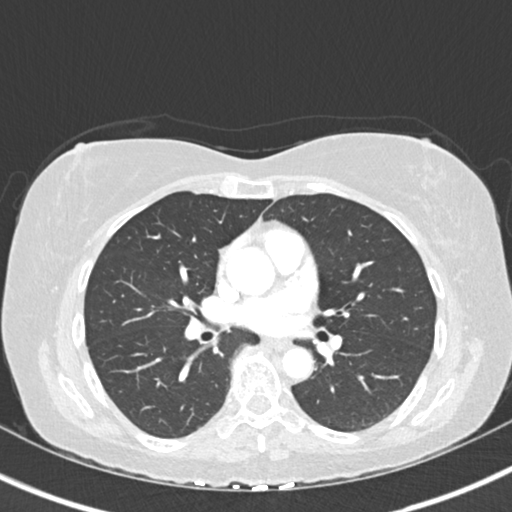
[im 103/164  lung]
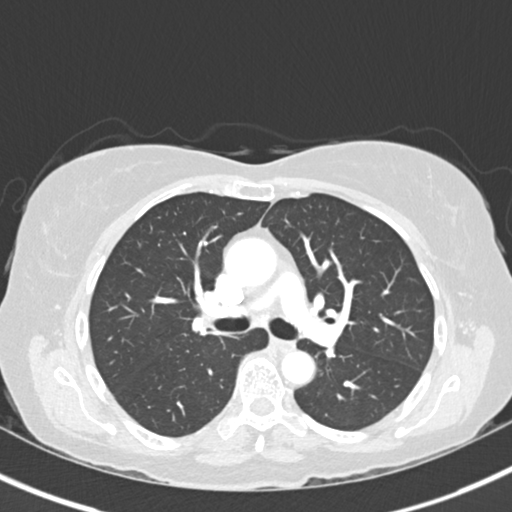
[im 115/164  mediastinal]
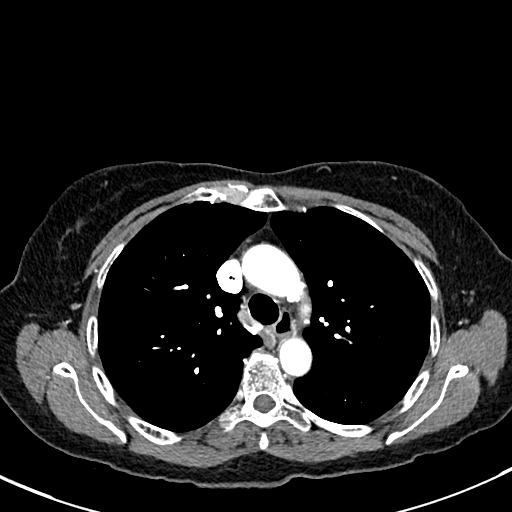
[im 115/164  lung]
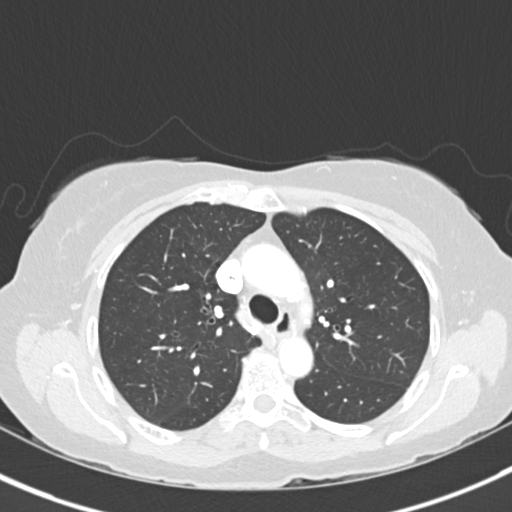
[im 127/164  lung]
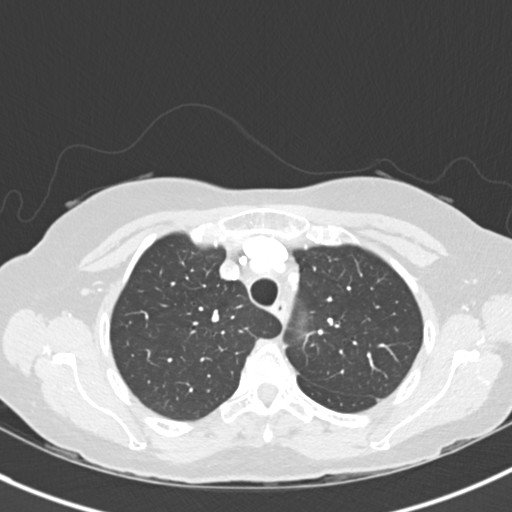
[im 139/164  lung]
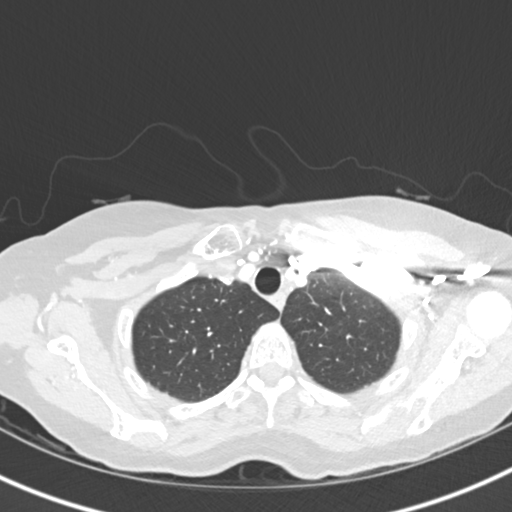
[im 151/164  lung]
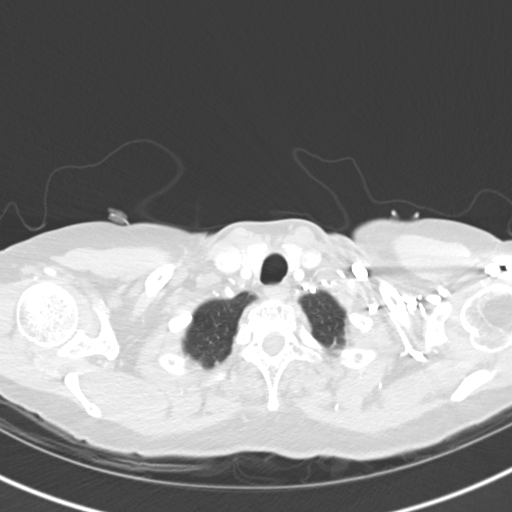

[Series 5: coronal · coronal · 0.51mm/px · 3 of 99 slices shown]
[im 20/99  lung]
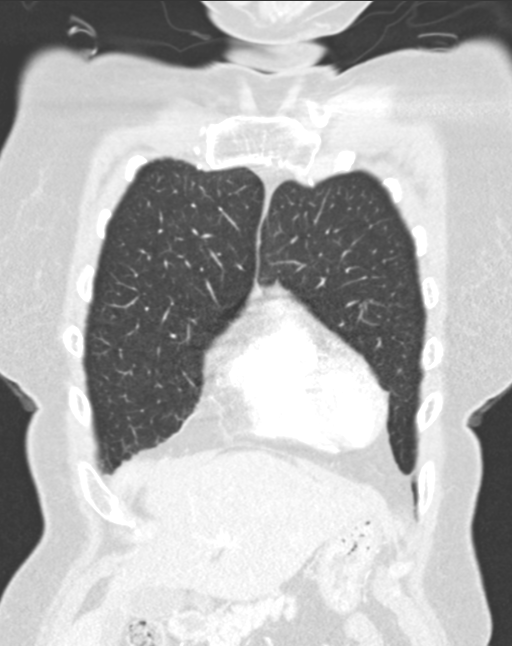
[im 40/99  lung]
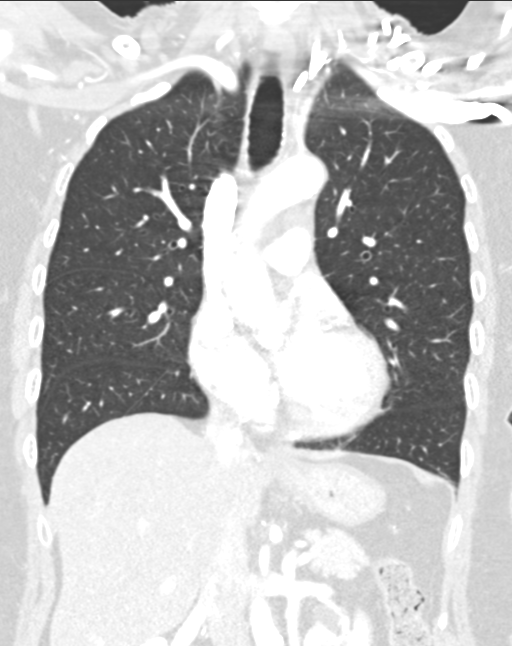
[im 59/99  lung]
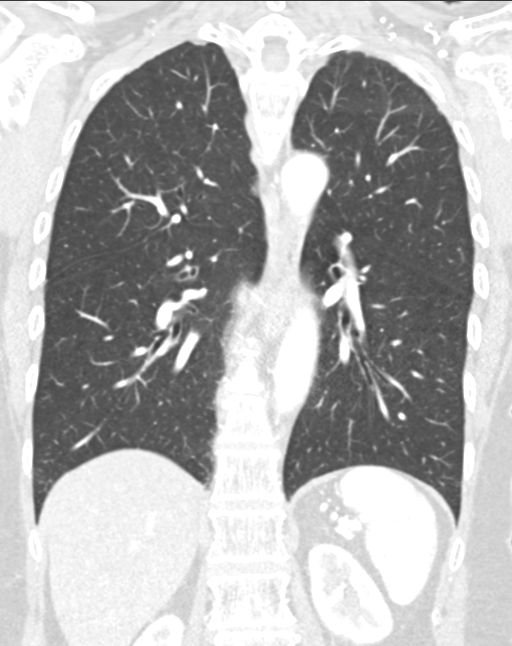

[15 of 36 positions shown; findings below may reference images not displayed]

FINDINGS: Cardiovascular: No significant vascular findings. Normal heart size.
No pericardial effusion. Thoracic aortic atherosclerosis.

Mediastinum/Nodes: No enlarged mediastinal, hilar, or axillary lymph
nodes. Thyroid gland, trachea, and esophagus demonstrate no
significant findings.

Lungs/Pleura: Lungs are clear. No pleural effusion or pneumothorax.

Upper Abdomen: No acute abnormality.

Musculoskeletal: No chest wall abnormality. No acute or significant
osseous findings.
IMPRESSION: 1. No acute cardiopulmonary disease.
2.  Aortic Atherosclerosis ([id number]-254.4).

## 2023-02-19 ENCOUNTER — Encounter: Payer: Self-pay | Admitting: Pediatrics

## 2023-04-09 NOTE — Progress Notes (Deleted)
 [facility] Initial Consultation   Referring Provider [facility] [address] [place],  [place] [address]  Primary Care Provider [facility] [place]  Patient Profile: Jasmin Mcintosh is a 73 y.o. female who is seen in consultation in the [facility] at the request of Dr. [facility] for evaluation and management of the problem(s) noted below.  Problem List: Laryngopharyngeal reflux   History of Present Illness   Jasmin Mcintosh is a 73 y.o. female with a history of ***.   Last colonoscopy: *** Last endoscopy: ***  Last Abd CT/CTE/MRE: ***  GI Review of Symptoms Significant for {GIROS:50592}. Otherwise negative.  General Review of Systems  Review of systems is significant for the pertinent positives and negatives as listed per the HPI.  Full ROS is otherwise negative.  Past Medical History   Past Medical History:  Diagnosis Date   Arthritis    Chest pain at rest    Hypercholesterolemia    Hyperlipemia    Osteoporosis    Restless leg    Vitamin D deficiency    Vitamin D insufficiency       Past Surgical History  No past surgical history on file.    Allergies and Medications  No Known Allergies  @MEDSTODAY @  Family History   Family History  Problem Relation Age of Onset   Diabetes Father    Hyperlipidemia Sister     GI Specific Family History: {gifamhx:50061}   Social History   Social History   Social History Narrative   Not on file   Ka reports that she has never smoked. She has never used smokeless tobacco. She reports that she does not currently use alcohol. She reports that she does not use drugs.  Vital Signs and Physical Examination  There were no vitals filed for this visit. There is no height or weight on file to calculate BMI.    Constitutional: {Blank Single:19197::healthy,thin,chronically ill,chronically ill and cachectic,well-nourished} appearing nontoxic  appearing 73 y.o. female in no apparent distress.   Eyes: {Eyes:44023} Head: {Head:44024:x} Neck: {Neck:44025:x} Pulmonary: {Chest:44026} CV: {Heart:44027:x} GI/Abdomen: {BS:44033}, {Soft:44034}, {Distention:44035}, {Pain:44036}, {HSM:44037:x}, {Masses:44038}, {Other (Optional):44039} Anorectal: {Rectal:44028:x} Neuro: {Neuro:44031:x} Skin: {Skin:44029:x} Bilateral Extremities: {Ext:44032:x}   Review of Data  The following data was reviewed at the time of this encounter:  Laboratory Studies   Lab Results  Component Value Date   WBC 6.0 10/20/2006   HGB 13.9 10/20/2006   HCT 40.7 10/20/2006   MCV 88.7 10/20/2006   PLT 277 10/20/2006     Chemistry      Component Value Date/Time   NA 140 11/18/2017 1001   K 4.1 11/18/2017 1001   CL 104 11/18/2017 1001   CO2 28 11/18/2017 1001   BUN 10 11/18/2017 1001   CREATININE 0.78 11/18/2017 1001      Component Value Date/Time   CALCIUM 9.9 11/18/2017 1001      No results found for: TSH   Imaging Studies  @lastct @ @lastmri @  GI Procedures and Studies      Clinical Impression  It is my clinical impression that Jasmin Mcintosh is a 73 y.o. female with;  ***  Plan  *** *** *** *** ***  Planned Follow Up No follow-ups on file.  The patient or caregiver verbalized understanding of the material covered, with no barriers to understanding. All questions were answered. Patient or caregiver is agreeable with the plan outlined above.    It was a pleasure to see Jasmin Mcintosh.  If you have any questions  or concerns regarding this evaluation, do not hesitate to contact me.  Inocente Hausen, MD [facility]

## 2023-04-10 ENCOUNTER — Ambulatory Visit: Payer: Self-pay | Admitting: Pediatrics

## 2023-11-11 ENCOUNTER — Other Ambulatory Visit: Payer: Self-pay | Admitting: Podiatry

## 2023-11-11 ENCOUNTER — Ambulatory Visit (INDEPENDENT_AMBULATORY_CARE_PROVIDER_SITE_OTHER): Payer: Self-pay

## 2023-11-11 ENCOUNTER — Ambulatory Visit (INDEPENDENT_AMBULATORY_CARE_PROVIDER_SITE_OTHER): Payer: Self-pay | Admitting: Podiatry

## 2023-11-11 DIAGNOSIS — M2011 Hallux valgus (acquired), right foot: Secondary | ICD-10-CM | POA: Diagnosis not present

## 2023-11-11 DIAGNOSIS — M2041 Other hammer toe(s) (acquired), right foot: Secondary | ICD-10-CM

## 2023-11-11 DIAGNOSIS — M24574 Contracture, right foot: Secondary | ICD-10-CM | POA: Diagnosis not present

## 2023-11-11 DIAGNOSIS — Z01818 Encounter for other preprocedural examination: Secondary | ICD-10-CM

## 2023-11-11 NOTE — Progress Notes (Unsigned)
 Subjective:  Patient ID: Jasmin Mcintosh, female    DOB: [birth year],  MRN: [id number]  Chief Complaint  Patient presents with   Foot Pain    Burning sensation on foot     73 y.o. female presents with the above complaint.  Patient notes with continuous pain to the right bunion deformity as well as right second digit.  She states there is burning sensation causing a lot of discomfort especially while ambulating she wanted to discuss surgical options for this she is tried shoe gear modification padding offloading none of which has helped she wishes to have surgical intervention.  She denies any other acute complaints pain scale is 5 out of 10 especially during ambulation.  Review of Systems: Negative except as noted in the HPI. Denies N/V/F/Ch.  Past Medical History:  Diagnosis Date   Arthritis    Chest pain at rest    Hypercholesterolemia    Hyperlipemia    Osteoporosis    Restless leg    Vitamin D deficiency    Vitamin D insufficiency     Current Outpatient Medications:    albuterol (PROVENTIL HFA;VENTOLIN HFA) 108 (90 Base) MCG/ACT inhaler, INHALE 2 PUFFS BY MOUTH EVERY 6 HOURS AS NEEDED FOR WHEEZING, Disp: , Rfl: 0   atorvastatin (LIPITOR) 20 MG tablet, Take 20 mg by mouth daily., Disp: , Rfl:    beclomethasone (QVAR  REDIHALER) 80 MCG/ACT inhaler, Inhale 2 puffs into the lungs 2 (two) times daily., Disp: 1 Inhaler, Rfl: 6   beclomethasone (QVAR ) 80 MCG/ACT inhaler, Inhale 1 puff into the lungs 2 (two) times daily., Disp: 1 Inhaler, Rfl: 6   cetirizine (ZYRTEC) 10 MG tablet, Take 10 mg by mouth daily., Disp: , Rfl:    cholecalciferol (QC VITAMIN D3) 400 units TABS tablet, Take 800 Units by mouth., Disp: , Rfl:    ibandronate (BONIVA) 150 MG tablet, Take 150 mg by mouth every 30 (thirty) days. Take in the morning with a full glass of water, on an empty stomach, and do not take anything else by mouth or lie down for the next 30 min., Disp: , Rfl:    Ibuprofen (ADVIL PO), Take by mouth.,  Disp: , Rfl:    montelukast (SINGULAIR) 10 MG tablet, Take 10 mg by mouth daily., Disp: , Rfl: 1   omeprazole  (PRILOSEC) 20 MG capsule, Take 1 capsule by mouth once daily, Disp: 30 capsule, Rfl: 0   pantoprazole (PROTONIX) 40 MG tablet, Take 40 mg by mouth daily., Disp: , Rfl: 1   traMADol (ULTRAM) 50 MG tablet, Take by mouth every 6 (six) hours as needed., Disp: , Rfl:   Social History   Tobacco Use  Smoking Status Never  Smokeless Tobacco Never    No Known Allergies Objective:  There were no vitals filed for this visit. There is no height or weight on file to calculate BMI. Constitutional Well developed. Well nourished.  Vascular Dorsalis pedis pulses palpable bilaterally. Posterior tibial pulses palpable bilaterally. Capillary refill normal to all digits.  No cyanosis or clubbing noted. Pedal hair growth normal.  Neurologic Normal speech. Oriented to person, place, and time. Epicritic sensation to light touch grossly present bilaterally.  Dermatologic Nails well groomed and normal in appearance. No open wounds. No skin lesions.  Orthopedic: Normal joint ROM without pain or crepitus bilaterally. Hallux abductovalgus deformity present is a track bound no tracking deformity no arthritis noted of the first metatarsophalangeal joint.  No intra-articular pain noted. Left 1st MPJ diminished range of motion. Left 1st  TMT without gross hypermobility. Right 1st MPJ diminished range of motion  Right 1st TMT without gross hypermobility. Lesser digital contractures present right.  Right second digit semiflexible hammertoe contracture noted right second metatarsophalangeal joint contracture noted   Radiographs: Taken and reviewed. Hallux abductovalgus deformity present. Metatarsal parabola normal. 1st/2nd IMA: Moderate; TSP: 5 out of 7  Assessment:   1. Encounter for preoperative examination for general surgical procedure   2. Hav (hallux abducto valgus), right   3. Hammertoe of  second toe of right foot   4. Joint contracture of foot, right    Plan:  Patient was evaluated and treated and all questions answered.  Hallux abductovalgus deformity, right foot -XR as above. -Patient has failed all conservative therapy and wishes to proceed with surgical intervention. All risks, benefits, and alternatives discussed with patient. No guarantees given. Consent reviewed and signed by patient. Post-op course explained at length. -Planned procedures: Right chevron osteotomy with possible phalangeal osteotomy and right second digit hammertoe contracture with possible capsulotomy of the second metatarsal phalangeal joint -Risk factors: None - I discussed my preoperative and postop plan with the patient in extensive detail she wishes to proceed with the surgery despite the risks.  She has failed all conservative care listed above. -Informed surgical risk consent was reviewed and read aloud to the patient.  I reviewed the films.  I have discussed my findings with the patient in great detail.  I have discussed all risks including but not limited to infection, stiffness, scarring, limp, disability, deformity, damage to blood vessels and nerves, numbness, poor healing, need for braces, arthritis, chronic pain, amputation, death.  All benefits and realistic expectations discussed in great detail.  I have made no promises as to the outcome.  I have provided realistic expectations.  I have offered the patient a 2nd opinion, which they have declined and assured me they preferred to proceed despite the risks   No follow-ups on file.   Right bunion chevron soteotomy phalanglea  Right second hammertoe with capsulotomy

## 2023-11-13 ENCOUNTER — Telehealth: Payer: Self-pay | Admitting: Podiatry

## 2023-11-13 NOTE — Telephone Encounter (Signed)
 Received surgical consent forms.  Left message for pt to call to get the surgery scheduled.

## 2024-01-20 ENCOUNTER — Other Ambulatory Visit (HOSPITAL_BASED_OUTPATIENT_CLINIC_OR_DEPARTMENT_OTHER): Payer: Self-pay

## 2024-04-06 ENCOUNTER — Ambulatory Visit: Payer: Self-pay

## 2024-04-08 ENCOUNTER — Emergency Department (HOSPITAL_BASED_OUTPATIENT_CLINIC_OR_DEPARTMENT_OTHER): Admission: EM | Admit: 2024-04-08 | Discharge: 2024-04-08 | Disposition: A | Discharge status: Home / Self Care

## 2024-04-08 ENCOUNTER — Encounter (HOSPITAL_BASED_OUTPATIENT_CLINIC_OR_DEPARTMENT_OTHER): Payer: Self-pay

## 2024-04-08 ENCOUNTER — Emergency Department (HOSPITAL_BASED_OUTPATIENT_CLINIC_OR_DEPARTMENT_OTHER)

## 2024-04-08 ENCOUNTER — Other Ambulatory Visit: Payer: Self-pay

## 2024-04-08 DIAGNOSIS — M7989 Other specified soft tissue disorders: Secondary | ICD-10-CM

## 2024-04-08 DIAGNOSIS — M7121 Synovial cyst of popliteal space [Baker], right knee: Secondary | ICD-10-CM | POA: Diagnosis not present

## 2024-04-08 DIAGNOSIS — I10 Essential (primary) hypertension: Secondary | ICD-10-CM | POA: Diagnosis not present

## 2024-04-08 DIAGNOSIS — R2241 Localized swelling, mass and lump, right lower limb: Secondary | ICD-10-CM | POA: Diagnosis present

## 2024-04-08 HISTORY — DX: Essential (primary) hypertension: I10

## 2024-04-08 LAB — COMPREHENSIVE METABOLIC PANEL WITH GFR
ALT: 20 U/L (ref 0–44)
AST: 20 U/L (ref 15–41)
Albumin: 4.2 g/dL (ref 3.5–5.0)
Alkaline Phosphatase: 79 U/L (ref 38–126)
Anion gap: 11 (ref 5–15)
BUN: 16 mg/dL (ref 8–23)
CO2: 26 mmol/L (ref 22–32)
Calcium: 9.6 mg/dL (ref 8.9–10.3)
Chloride: 103 mmol/L (ref 98–111)
Creatinine, Ser: 0.68 mg/dL (ref 0.44–1.00)
GFR, Estimated: >60 mL/min
Glucose, Bld: 97 mg/dL (ref 70–99)
Potassium: 4 mmol/L (ref 3.5–5.1)
Sodium: 140 mmol/L (ref 135–145)
Total Bilirubin: 0.4 mg/dL (ref 0.0–1.2)
Total Protein: 6.7 g/dL (ref 6.5–8.1)

## 2024-04-08 LAB — CBC WITH DIFFERENTIAL/PLATELET
Abs Immature Granulocytes: 0.02 K/uL (ref 0.00–0.07)
Basophils Absolute: 0 K/uL (ref 0.0–0.1)
Basophils Relative: 0 %
Eosinophils Absolute: 0.5 K/uL (ref 0.0–0.5)
Eosinophils Relative: 7 %
HCT: 36.8 % (ref 36.0–46.0)
Hemoglobin: 12.3 g/dL (ref 12.0–15.0)
Immature Granulocytes: 0 %
Lymphocytes Relative: 36 %
Lymphs Abs: 2.6 K/uL (ref 0.7–4.0)
MCH: 29.4 pg (ref 26.0–34.0)
MCHC: 33.4 g/dL (ref 30.0–36.0)
MCV: 88 fL (ref 80.0–100.0)
Monocytes Absolute: 0.3 K/uL (ref 0.1–1.0)
Monocytes Relative: 5 %
Neutro Abs: 3.7 K/uL (ref 1.7–7.7)
Neutrophils Relative %: 52 %
Platelets: 255 K/uL (ref 150–400)
RBC: 4.18 MIL/uL (ref 3.87–5.11)
RDW: 12.9 % (ref 11.5–15.5)
WBC: 7.2 K/uL (ref 4.0–10.5)
nRBC: 0 % (ref 0.0–0.2)

## 2024-04-08 NOTE — ED Notes (Signed)
 Reviewed discharge instructions and follow up with patient. Pt states understanding. Ambulatory at discharge

## 2024-04-08 NOTE — ED Triage Notes (Addendum)
 Reports swelling of R foot and R calf since last night. No obvious discoloration noted. No known injury  Ambulatory  Denies [facility] or chest pain Denies acute pain

## 2024-04-08 NOTE — ED Provider Notes (Signed)
 " [facility] Provider Note   CSN: [id number] Arrival date & time: 04/08/24  1350     Patient presents with: Leg Swelling   Jasmin Mcintosh is a 74 y.o. female.  Patient with past history significant for hyperlipidemia, arthritis, osteoporosis, restless leg presents to the emergency department with concerns of leg swelling.  Reports that she noticed right foot, right calf swelling that developed since last night.  She denies any recent injury or fall.  Does report the back in July she had a fall resulting in right knee pain that has been ongoing since then.  States that she applied some sort of compressive bandage around her knee last night and slept with this on prior to the development of the symptoms.  She states that she primarily feels pain to the right calf but is concerned about the swelling in her right lower leg.  Denies any fever, chills or bodyaches.  She does report that she started on amlodipine about 2 weeks ago due to concerns for progressively elevating blood pressure.  She has not had any follow-up regarding her amlodipine use since initially starting this medication.  Denies any other recent medication changes.  No history of PE or DVT.  She denies any shortness of breath or chest pain.  HPI     Prior to Admission medications  Medication Sig Start Date End Date Taking? Authorizing Provider  albuterol (PROVENTIL HFA;VENTOLIN HFA) 108 (90 Base) MCG/ACT inhaler INHALE 2 PUFFS BY MOUTH EVERY 6 HOURS AS NEEDED FOR WHEEZING 10/07/17   [provider]  atorvastatin (LIPITOR) 20 MG tablet Take 20 mg by mouth daily.    [provider]  beclomethasone (QVAR  REDIHALER) 80 MCG/ACT inhaler Inhale 2 puffs into the lungs 2 (two) times daily. 04/20/18   Olalere, Jennet A, MD  beclomethasone (QVAR ) 80 MCG/ACT inhaler Inhale 1 puff into the lungs 2 (two) times daily. 04/07/18   Neda Jennet LABOR, MD  cetirizine (ZYRTEC) 10 MG tablet Take  10 mg by mouth daily.    [provider]  cholecalciferol (QC VITAMIN D3) 400 units TABS tablet Take 800 Units by mouth.    [provider]  ibandronate (BONIVA) 150 MG tablet Take 150 mg by mouth every 30 (thirty) days. Take in the morning with a full glass of water, on an empty stomach, and do not take anything else by mouth or lie down for the next 30 min.    [provider]  Ibuprofen (ADVIL PO) Take by mouth.    [provider]  montelukast (SINGULAIR) 10 MG tablet Take 10 mg by mouth daily. 11/29/17   [provider]  omeprazole  (PRILOSEC) 20 MG capsule Take 1 capsule by mouth once daily 02/09/19   Olalere, Adewale A, MD  pantoprazole (PROTONIX) 40 MG tablet Take 40 mg by mouth daily. 10/07/17   [provider]  traMADol (ULTRAM) 50 MG tablet Take by mouth every 6 (six) hours as needed.    [provider]    Allergies: Patient has no known allergies.    Review of Systems  Cardiovascular:  Positive for leg swelling.  All other systems reviewed and are negative.   Updated Vital Signs BP (!) 175/93 (BP Location: Right Arm)   Pulse 80   Temp 98.4 F (36.9 C)   Resp 18   SpO2 99%   Physical Exam Vitals and nursing note reviewed.  Constitutional:      General: She is not in  acute distress.    Appearance: She is well-developed.  HENT:     Head: Normocephalic and atraumatic.  Eyes:     Conjunctiva/sclera: Conjunctivae normal.  Cardiovascular:     Rate and Rhythm: Normal rate and regular rhythm.     Heart sounds: No murmur heard. Pulmonary:     Effort: Pulmonary effort is normal. No respiratory distress.     Breath sounds: Normal breath sounds.  Abdominal:     Palpations: Abdomen is soft.     Tenderness: There is no abdominal tenderness.  Musculoskeletal:        General: Swelling and tenderness present.     Cervical back: Neck supple.     Comments: Notable swelling to the right lower leg beginning from the mid calf  on down through the right foot.  There is tenderness to palpation along the right calf.  No difficulty with range of motion of the right knee beyond some pain reported to the medial aspect with movement.  Skin:    General: Skin is warm and dry.     Capillary Refill: Capillary refill takes less than 2 seconds.     Findings: No erythema.     Comments: No appreciable torturous veins in the right leg.  The right knee is unremarkable with no appreciable erythema, warmth, or induration of the skin.  Neurological:     Mental Status: She is alert.  Psychiatric:        Mood and Affect: Mood normal.     (all labs ordered are listed, but only abnormal results are displayed) Labs Reviewed  CBC WITH DIFFERENTIAL/PLATELET  COMPREHENSIVE METABOLIC PANEL WITH GFR    EKG: None  Radiology: US  Venous Img Lower Unilateral Right Result Date: 04/08/2024 CLINICAL DATA:  Right lower extremity edema EXAM: RIGHT LOWER EXTREMITY VENOUS DOPPLER ULTRASOUND TECHNIQUE: Gray-scale sonography with compression, as well as color and duplex ultrasound, were performed to evaluate the deep venous system(s) from the level of the common femoral vein through the popliteal and proximal calf veins. COMPARISON:  None Available. FINDINGS: VENOUS Normal compressibility of the common femoral, superficial femoral, and popliteal veins, as well as the visualized calf veins. Visualized portions of profunda femoral vein and great saphenous vein unremarkable. No filling defects to suggest DVT on grayscale or color Doppler imaging. Doppler waveforms show normal direction of venous flow, normal respiratory plasticity and response to augmentation. Limited views of the contralateral common femoral vein are unremarkable. OTHER Small fluid collection in the popliteal fossa consistent with a Baker's cyst. Additionally, there is a small amount of fluid in the suprapatellar recess consistent with a joint effusion. Limitations: none IMPRESSION: 1. No  evidence of deep venous thrombosis in the right lower extremity. 2. Small Baker's cyst. 3. Small knee joint effusion with fluid visible in the suprapatellar recess. Electronically Signed   By: Wilkie Lent M.D.   On: 04/08/2024 15:02     Procedures   Medications Ordered in the ED - No data to display                                  Medical Decision Making Amount and/or Complexity of Data Reviewed Labs: ordered.   This patient presents to the ED for concern of leg swelling, this involves an extensive number of treatment options, and is a complaint that carries with it a high risk of complications and morbidity.  The differential diagnosis includes cellulitis, DVT, CHF,  lymphedema, amlodipine side effect   Co morbidities that complicate the patient evaluation  Hypertension, hyperlipidemia, arthritis, osteoporosis, restless leg   Additional history obtained:  Additional history obtained from chart review   Lab Tests:  I Ordered, and personally interpreted labs.  The pertinent results include: CBC unremarked well, CMP unremarkable for significant electrolyte changes and stable renal function   Imaging Studies ordered:  I ordered imaging studies including ultrasound right lower extremity I independently visualized and interpreted imaging which showed negative for DVT, small Baker's cyst, small joint effusion in the suprapatellar recess I agree with the radiologist interpretation    Consultations Obtained:  I requested consultation with none,  and discussed lab and imaging findings as well as pertinent plan - they recommend: N/A   Problem List / ED Course / Critical interventions / Medication management  Patient presents to the emergency department following concerns of right leg swelling and pain.  Reports symptoms started last night into this morning after she had applied a compressive dressing to the right knee due to chronic pain since about July.  She has no  history of PE or DVT.  Not on blood thinners.  Did start amlodipine about 2 weeks ago but did not notice any notable swelling since on this medication. Exam reveals swelling to the right leg around the mid calf on down to the right foot.  There is trace edema bilaterally.  Tenderness palpation in the right calf.  No clearly appreciable tortuous veins. Workup today is unremarkable with a negative CBC, CMP.  Ultrasound negative for DVT in the right lower extremity. With reassuring workup today, low concern this time for DVT, Electra abnormality, or concerns for CHF given no reported cardiopulmonary symptoms.  I suspect the swelling of the leg was likely due to the compressive dressing that she is wearing overnight.  Encourage patient to use a compressive stocking that encompasses the entire leg to help reduce swelling as well as leg elevation.  Encouraged her to continue with her home medications and follow-up closely with her primary care provider for any other concerns.  Return precautions discussed such as concerns for new or worsening symptoms primarily with concerns such as shortness of breath.  She is otherwise stable for outpatient follow-up and discharged home. I have reviewed the patients home medicines and have made adjustments as needed   Social Determinants of Health:  None   Test / Admission - Considered:  Stable for outpatient follow-up.  Final diagnoses:  Right leg swelling  Synovial cyst of right popliteal space    ED Discharge Orders     None          Cecily Legrand LABOR, PA-C 04/08/24 1553  "

## 2024-04-08 NOTE — Discharge Instructions (Addendum)
 You were seen in the emergency department today for concerns of leg swelling.  Your labs and imaging were thankfully negative with no signs of any electrolyte changes or evidence of a blood clot in your right lower leg.  I do suspect your swelling was likely due to you wearing the band/compression overnight that likely caused fluid to accumulate in this leg.  You should use a compressive stocking that covers the entire right leg to help with swelling in this area.  He can also elevate your leg to help manage some of the swelling over the next several days.  Follow-up closely with your primary care provider and orthopedics/physical therapy for further evaluation and recommendations.  Return to the emergency department if you develop any shortness of breath, worsening swelling, or development of redness or severe pain in the right knee.
